# Patient Record
Sex: Female | Born: 2000
Health system: Southern US, Community
[De-identification: ages and names within clinical notes are randomized; demographics above are authoritative.]

## PROBLEM LIST (undated history)

## (undated) DIAGNOSIS — F909 Attention-deficit hyperactivity disorder, unspecified type: Secondary | ICD-10-CM

## (undated) DIAGNOSIS — F988 Other specified behavioral and emotional disorders with onset usually occurring in childhood and adolescence: Secondary | ICD-10-CM

## (undated) HISTORY — DX: Attention-deficit hyperactivity disorder, unspecified type: F90.9

## (undated) HISTORY — PX: ADENOIDECTOMY: SUR15

## (undated) HISTORY — PX: TEAR DUCT PROBING: SHX793

## (undated) HISTORY — PX: TONSILLECTOMY: SUR1361

---
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
## 2021-01-25 DIAGNOSIS — F319 Bipolar disorder, unspecified: Secondary | ICD-10-CM | POA: Diagnosis not present

## 2021-01-25 DIAGNOSIS — F411 Generalized anxiety disorder: Secondary | ICD-10-CM | POA: Diagnosis not present

## 2021-01-25 DIAGNOSIS — F902 Attention-deficit hyperactivity disorder, combined type: Secondary | ICD-10-CM | POA: Diagnosis not present

## 2021-02-22 DIAGNOSIS — F902 Attention-deficit hyperactivity disorder, combined type: Secondary | ICD-10-CM | POA: Diagnosis not present

## 2021-02-22 DIAGNOSIS — N3000 Acute cystitis without hematuria: Secondary | ICD-10-CM | POA: Diagnosis not present

## 2021-02-22 DIAGNOSIS — F319 Bipolar disorder, unspecified: Secondary | ICD-10-CM | POA: Diagnosis not present

## 2021-02-22 DIAGNOSIS — F411 Generalized anxiety disorder: Secondary | ICD-10-CM | POA: Diagnosis not present

## 2021-03-04 DIAGNOSIS — N3001 Acute cystitis with hematuria: Secondary | ICD-10-CM | POA: Diagnosis not present

## 2021-03-04 DIAGNOSIS — R300 Dysuria: Secondary | ICD-10-CM | POA: Diagnosis not present

## 2021-03-07 DIAGNOSIS — R300 Dysuria: Secondary | ICD-10-CM | POA: Diagnosis not present

## 2021-03-07 DIAGNOSIS — Z113 Encounter for screening for infections with a predominantly sexual mode of transmission: Secondary | ICD-10-CM | POA: Diagnosis not present

## 2021-04-12 DIAGNOSIS — F319 Bipolar disorder, unspecified: Secondary | ICD-10-CM | POA: Diagnosis not present

## 2021-04-12 DIAGNOSIS — F411 Generalized anxiety disorder: Secondary | ICD-10-CM | POA: Diagnosis not present

## 2021-04-12 DIAGNOSIS — F902 Attention-deficit hyperactivity disorder, combined type: Secondary | ICD-10-CM | POA: Diagnosis not present

## 2021-05-20 DIAGNOSIS — Z79899 Other long term (current) drug therapy: Secondary | ICD-10-CM | POA: Diagnosis not present

## 2021-05-20 DIAGNOSIS — F32A Depression, unspecified: Secondary | ICD-10-CM | POA: Diagnosis not present

## 2021-05-20 DIAGNOSIS — R45851 Suicidal ideations: Secondary | ICD-10-CM | POA: Diagnosis not present

## 2021-05-20 DIAGNOSIS — Z87891 Personal history of nicotine dependence: Secondary | ICD-10-CM | POA: Diagnosis not present

## 2021-05-20 DIAGNOSIS — F319 Bipolar disorder, unspecified: Secondary | ICD-10-CM | POA: Diagnosis not present

## 2021-05-20 DIAGNOSIS — Z20822 Contact with and (suspected) exposure to covid-19: Secondary | ICD-10-CM | POA: Diagnosis not present

## 2021-05-20 DIAGNOSIS — F419 Anxiety disorder, unspecified: Secondary | ICD-10-CM | POA: Diagnosis not present

## 2021-05-21 DIAGNOSIS — F319 Bipolar disorder, unspecified: Secondary | ICD-10-CM | POA: Diagnosis not present

## 2021-05-21 DIAGNOSIS — R45851 Suicidal ideations: Secondary | ICD-10-CM | POA: Diagnosis not present

## 2021-05-21 DIAGNOSIS — F99 Mental disorder, not otherwise specified: Secondary | ICD-10-CM | POA: Diagnosis not present

## 2021-05-31 DIAGNOSIS — F319 Bipolar disorder, unspecified: Secondary | ICD-10-CM | POA: Diagnosis not present

## 2021-05-31 DIAGNOSIS — F411 Generalized anxiety disorder: Secondary | ICD-10-CM | POA: Diagnosis not present

## 2021-05-31 DIAGNOSIS — F902 Attention-deficit hyperactivity disorder, combined type: Secondary | ICD-10-CM | POA: Diagnosis not present

## 2021-06-08 DIAGNOSIS — F4310 Post-traumatic stress disorder, unspecified: Secondary | ICD-10-CM | POA: Diagnosis not present

## 2021-06-08 DIAGNOSIS — F603 Borderline personality disorder: Secondary | ICD-10-CM | POA: Diagnosis not present

## 2021-06-08 DIAGNOSIS — F411 Generalized anxiety disorder: Secondary | ICD-10-CM | POA: Diagnosis not present

## 2021-06-08 DIAGNOSIS — F3173 Bipolar disorder, in partial remission, most recent episode manic: Secondary | ICD-10-CM | POA: Diagnosis not present

## 2021-06-21 DIAGNOSIS — F902 Attention-deficit hyperactivity disorder, combined type: Secondary | ICD-10-CM | POA: Diagnosis not present

## 2021-06-21 DIAGNOSIS — F319 Bipolar disorder, unspecified: Secondary | ICD-10-CM | POA: Diagnosis not present

## 2021-06-21 DIAGNOSIS — F411 Generalized anxiety disorder: Secondary | ICD-10-CM | POA: Diagnosis not present

## 2021-06-22 DIAGNOSIS — F3173 Bipolar disorder, in partial remission, most recent episode manic: Secondary | ICD-10-CM | POA: Diagnosis not present

## 2021-06-22 DIAGNOSIS — F4310 Post-traumatic stress disorder, unspecified: Secondary | ICD-10-CM | POA: Diagnosis not present

## 2021-06-22 DIAGNOSIS — F411 Generalized anxiety disorder: Secondary | ICD-10-CM | POA: Diagnosis not present

## 2021-06-22 DIAGNOSIS — F603 Borderline personality disorder: Secondary | ICD-10-CM | POA: Diagnosis not present

## 2021-06-29 DIAGNOSIS — F411 Generalized anxiety disorder: Secondary | ICD-10-CM | POA: Diagnosis not present

## 2021-06-29 DIAGNOSIS — F3173 Bipolar disorder, in partial remission, most recent episode manic: Secondary | ICD-10-CM | POA: Diagnosis not present

## 2021-06-29 DIAGNOSIS — F603 Borderline personality disorder: Secondary | ICD-10-CM | POA: Diagnosis not present

## 2021-06-29 DIAGNOSIS — F4310 Post-traumatic stress disorder, unspecified: Secondary | ICD-10-CM | POA: Diagnosis not present

## 2021-07-06 DIAGNOSIS — F4310 Post-traumatic stress disorder, unspecified: Secondary | ICD-10-CM | POA: Diagnosis not present

## 2021-07-06 DIAGNOSIS — F3173 Bipolar disorder, in partial remission, most recent episode manic: Secondary | ICD-10-CM | POA: Diagnosis not present

## 2021-07-06 DIAGNOSIS — F411 Generalized anxiety disorder: Secondary | ICD-10-CM | POA: Diagnosis not present

## 2021-07-06 DIAGNOSIS — F603 Borderline personality disorder: Secondary | ICD-10-CM | POA: Diagnosis not present

## 2021-07-13 DIAGNOSIS — F603 Borderline personality disorder: Secondary | ICD-10-CM | POA: Diagnosis not present

## 2021-07-13 DIAGNOSIS — F4310 Post-traumatic stress disorder, unspecified: Secondary | ICD-10-CM | POA: Diagnosis not present

## 2021-07-13 DIAGNOSIS — F411 Generalized anxiety disorder: Secondary | ICD-10-CM | POA: Diagnosis not present

## 2021-07-13 DIAGNOSIS — F3173 Bipolar disorder, in partial remission, most recent episode manic: Secondary | ICD-10-CM | POA: Diagnosis not present

## 2021-07-20 DIAGNOSIS — F902 Attention-deficit hyperactivity disorder, combined type: Secondary | ICD-10-CM | POA: Diagnosis not present

## 2021-07-20 DIAGNOSIS — F411 Generalized anxiety disorder: Secondary | ICD-10-CM | POA: Diagnosis not present

## 2021-07-20 DIAGNOSIS — F319 Bipolar disorder, unspecified: Secondary | ICD-10-CM | POA: Diagnosis not present

## 2021-07-22 DIAGNOSIS — F3173 Bipolar disorder, in partial remission, most recent episode manic: Secondary | ICD-10-CM | POA: Diagnosis not present

## 2021-07-22 DIAGNOSIS — F603 Borderline personality disorder: Secondary | ICD-10-CM | POA: Diagnosis not present

## 2021-07-22 DIAGNOSIS — F411 Generalized anxiety disorder: Secondary | ICD-10-CM | POA: Diagnosis not present

## 2021-07-22 DIAGNOSIS — F4310 Post-traumatic stress disorder, unspecified: Secondary | ICD-10-CM | POA: Diagnosis not present

## 2021-07-26 DIAGNOSIS — F3173 Bipolar disorder, in partial remission, most recent episode manic: Secondary | ICD-10-CM | POA: Diagnosis not present

## 2021-07-26 DIAGNOSIS — F411 Generalized anxiety disorder: Secondary | ICD-10-CM | POA: Diagnosis not present

## 2021-07-26 DIAGNOSIS — F603 Borderline personality disorder: Secondary | ICD-10-CM | POA: Diagnosis not present

## 2021-07-26 DIAGNOSIS — F4310 Post-traumatic stress disorder, unspecified: Secondary | ICD-10-CM | POA: Diagnosis not present

## 2021-08-02 DIAGNOSIS — F411 Generalized anxiety disorder: Secondary | ICD-10-CM | POA: Diagnosis not present

## 2021-08-02 DIAGNOSIS — F4310 Post-traumatic stress disorder, unspecified: Secondary | ICD-10-CM | POA: Diagnosis not present

## 2021-08-02 DIAGNOSIS — F3173 Bipolar disorder, in partial remission, most recent episode manic: Secondary | ICD-10-CM | POA: Diagnosis not present

## 2021-08-02 DIAGNOSIS — F603 Borderline personality disorder: Secondary | ICD-10-CM | POA: Diagnosis not present

## 2021-08-09 DIAGNOSIS — F603 Borderline personality disorder: Secondary | ICD-10-CM | POA: Diagnosis not present

## 2021-08-09 DIAGNOSIS — F411 Generalized anxiety disorder: Secondary | ICD-10-CM | POA: Diagnosis not present

## 2021-08-09 DIAGNOSIS — F3173 Bipolar disorder, in partial remission, most recent episode manic: Secondary | ICD-10-CM | POA: Diagnosis not present

## 2021-08-09 DIAGNOSIS — F4310 Post-traumatic stress disorder, unspecified: Secondary | ICD-10-CM | POA: Diagnosis not present

## 2021-08-31 DIAGNOSIS — F3173 Bipolar disorder, in partial remission, most recent episode manic: Secondary | ICD-10-CM | POA: Diagnosis not present

## 2021-08-31 DIAGNOSIS — F4310 Post-traumatic stress disorder, unspecified: Secondary | ICD-10-CM | POA: Diagnosis not present

## 2021-08-31 DIAGNOSIS — F603 Borderline personality disorder: Secondary | ICD-10-CM | POA: Diagnosis not present

## 2021-08-31 DIAGNOSIS — F411 Generalized anxiety disorder: Secondary | ICD-10-CM | POA: Diagnosis not present

## 2021-09-07 DIAGNOSIS — F3173 Bipolar disorder, in partial remission, most recent episode manic: Secondary | ICD-10-CM | POA: Diagnosis not present

## 2021-09-07 DIAGNOSIS — F411 Generalized anxiety disorder: Secondary | ICD-10-CM | POA: Diagnosis not present

## 2021-09-07 DIAGNOSIS — F603 Borderline personality disorder: Secondary | ICD-10-CM | POA: Diagnosis not present

## 2021-09-07 DIAGNOSIS — F4310 Post-traumatic stress disorder, unspecified: Secondary | ICD-10-CM | POA: Diagnosis not present

## 2021-09-08 DIAGNOSIS — Z9152 Personal history of nonsuicidal self-harm: Secondary | ICD-10-CM | POA: Diagnosis not present

## 2021-09-08 DIAGNOSIS — R45851 Suicidal ideations: Secondary | ICD-10-CM | POA: Diagnosis not present

## 2021-09-08 DIAGNOSIS — Z20822 Contact with and (suspected) exposure to covid-19: Secondary | ICD-10-CM | POA: Diagnosis not present

## 2021-09-08 DIAGNOSIS — F339 Major depressive disorder, recurrent, unspecified: Secondary | ICD-10-CM | POA: Diagnosis not present

## 2021-09-09 DIAGNOSIS — M25569 Pain in unspecified knee: Secondary | ICD-10-CM | POA: Diagnosis not present

## 2021-09-09 DIAGNOSIS — Z9152 Personal history of nonsuicidal self-harm: Secondary | ICD-10-CM | POA: Diagnosis not present

## 2021-09-09 DIAGNOSIS — F4310 Post-traumatic stress disorder, unspecified: Secondary | ICD-10-CM | POA: Diagnosis not present

## 2021-09-09 DIAGNOSIS — H9193 Unspecified hearing loss, bilateral: Secondary | ICD-10-CM | POA: Diagnosis not present

## 2021-09-09 DIAGNOSIS — R45851 Suicidal ideations: Secondary | ICD-10-CM | POA: Diagnosis not present

## 2021-09-09 DIAGNOSIS — F339 Major depressive disorder, recurrent, unspecified: Secondary | ICD-10-CM | POA: Diagnosis not present

## 2021-09-09 DIAGNOSIS — Z62810 Personal history of physical and sexual abuse in childhood: Secondary | ICD-10-CM | POA: Diagnosis not present

## 2021-09-09 DIAGNOSIS — F319 Bipolar disorder, unspecified: Secondary | ICD-10-CM | POA: Diagnosis not present

## 2021-09-09 DIAGNOSIS — G40909 Epilepsy, unspecified, not intractable, without status epilepticus: Secondary | ICD-10-CM | POA: Diagnosis not present

## 2021-09-09 DIAGNOSIS — F99 Mental disorder, not otherwise specified: Secondary | ICD-10-CM | POA: Diagnosis not present

## 2021-09-09 DIAGNOSIS — F3181 Bipolar II disorder: Secondary | ICD-10-CM | POA: Diagnosis not present

## 2021-09-09 DIAGNOSIS — K219 Gastro-esophageal reflux disease without esophagitis: Secondary | ICD-10-CM | POA: Diagnosis not present

## 2021-09-09 DIAGNOSIS — Z9151 Personal history of suicidal behavior: Secondary | ICD-10-CM | POA: Diagnosis not present

## 2021-09-14 DIAGNOSIS — F411 Generalized anxiety disorder: Secondary | ICD-10-CM | POA: Diagnosis not present

## 2021-09-14 DIAGNOSIS — F3173 Bipolar disorder, in partial remission, most recent episode manic: Secondary | ICD-10-CM | POA: Diagnosis not present

## 2021-09-14 DIAGNOSIS — F4310 Post-traumatic stress disorder, unspecified: Secondary | ICD-10-CM | POA: Diagnosis not present

## 2021-09-14 DIAGNOSIS — F603 Borderline personality disorder: Secondary | ICD-10-CM | POA: Diagnosis not present

## 2021-09-15 DIAGNOSIS — F411 Generalized anxiety disorder: Secondary | ICD-10-CM | POA: Diagnosis not present

## 2021-09-15 DIAGNOSIS — F902 Attention-deficit hyperactivity disorder, combined type: Secondary | ICD-10-CM | POA: Diagnosis not present

## 2021-09-15 DIAGNOSIS — F319 Bipolar disorder, unspecified: Secondary | ICD-10-CM | POA: Diagnosis not present

## 2021-09-21 DIAGNOSIS — F603 Borderline personality disorder: Secondary | ICD-10-CM | POA: Diagnosis not present

## 2021-09-21 DIAGNOSIS — F3173 Bipolar disorder, in partial remission, most recent episode manic: Secondary | ICD-10-CM | POA: Diagnosis not present

## 2021-09-21 DIAGNOSIS — F4310 Post-traumatic stress disorder, unspecified: Secondary | ICD-10-CM | POA: Diagnosis not present

## 2021-09-21 DIAGNOSIS — F411 Generalized anxiety disorder: Secondary | ICD-10-CM | POA: Diagnosis not present

## 2021-10-05 DIAGNOSIS — F4310 Post-traumatic stress disorder, unspecified: Secondary | ICD-10-CM | POA: Diagnosis not present

## 2021-10-05 DIAGNOSIS — F411 Generalized anxiety disorder: Secondary | ICD-10-CM | POA: Diagnosis not present

## 2021-10-05 DIAGNOSIS — F603 Borderline personality disorder: Secondary | ICD-10-CM | POA: Diagnosis not present

## 2021-10-05 DIAGNOSIS — F3173 Bipolar disorder, in partial remission, most recent episode manic: Secondary | ICD-10-CM | POA: Diagnosis not present

## 2021-10-08 DIAGNOSIS — O200 Threatened abortion: Secondary | ICD-10-CM | POA: Diagnosis not present

## 2021-10-08 DIAGNOSIS — Z6791 Unspecified blood type, Rh negative: Secondary | ICD-10-CM | POA: Diagnosis not present

## 2021-10-08 DIAGNOSIS — Z3A01 Less than 8 weeks gestation of pregnancy: Secondary | ICD-10-CM | POA: Diagnosis not present

## 2021-10-08 DIAGNOSIS — O209 Hemorrhage in early pregnancy, unspecified: Secondary | ICD-10-CM | POA: Diagnosis not present

## 2021-10-09 DIAGNOSIS — O200 Threatened abortion: Secondary | ICD-10-CM | POA: Diagnosis not present

## 2021-10-09 DIAGNOSIS — Z3A01 Less than 8 weeks gestation of pregnancy: Secondary | ICD-10-CM | POA: Diagnosis not present

## 2021-10-11 DIAGNOSIS — O200 Threatened abortion: Secondary | ICD-10-CM | POA: Diagnosis not present
